# Patient Record
Sex: Female | Born: 1985 | Race: White | Hispanic: No
Health system: Western US, Academic
[De-identification: ages and names within clinical notes are randomized; demographics above are authoritative.]

## PROBLEM LIST (undated history)

## (undated) DIAGNOSIS — R012 Other cardiac sounds: Secondary | ICD-10-CM

## (undated) HISTORY — PX: NO PRIOR SURGERIES: 100

## (undated) HISTORY — DX: Other cardiac sounds: R01.2

## (undated) DEATH — deceased

---
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
## 2005-07-14 ENCOUNTER — Emergency Department (HOSPITAL_COMMUNITY): Admission: EM | Admit: 2005-07-14 | Discharge: 2005-07-14 | Payer: Self-pay | Admitting: Emergency Medicine

## 2009-06-30 ENCOUNTER — Emergency Department (HOSPITAL_COMMUNITY): Admission: EM | Admit: 2009-06-30 | Discharge: 2009-06-30 | Payer: Self-pay | Admitting: Emergency Medicine

## 2011-03-03 ENCOUNTER — Other Ambulatory Visit: Payer: Self-pay | Admitting: Obstetrics & Gynecology

## 2011-03-03 DIAGNOSIS — Z30431 Encounter for routine checking of intrauterine contraceptive device: Secondary | ICD-10-CM

## 2011-03-04 ENCOUNTER — Other Ambulatory Visit: Payer: Self-pay

## 2011-03-10 ENCOUNTER — Ambulatory Visit
Admission: RE | Admit: 2011-03-10 | Discharge: 2011-03-10 | Disposition: A | Discharge status: Home / Self Care | Payer: 59 | Source: Ambulatory Visit | Attending: Obstetrics & Gynecology | Admitting: Obstetrics & Gynecology

## 2011-03-10 DIAGNOSIS — Z30431 Encounter for routine checking of intrauterine contraceptive device: Secondary | ICD-10-CM

## 2011-11-28 ENCOUNTER — Encounter: Payer: Self-pay | Admitting: Internal Medicine

## 2011-11-28 ENCOUNTER — Ambulatory Visit (INDEPENDENT_AMBULATORY_CARE_PROVIDER_SITE_OTHER): Payer: 59 | Admitting: Internal Medicine

## 2011-11-28 ENCOUNTER — Ambulatory Visit (INDEPENDENT_AMBULATORY_CARE_PROVIDER_SITE_OTHER)
Admission: RE | Admit: 2011-11-28 | Discharge: 2011-11-28 | Disposition: A | Discharge status: Home / Self Care | Payer: 59 | Source: Ambulatory Visit | Attending: Internal Medicine | Admitting: Internal Medicine

## 2011-11-28 ENCOUNTER — Other Ambulatory Visit (INDEPENDENT_AMBULATORY_CARE_PROVIDER_SITE_OTHER): Payer: 59

## 2011-11-28 VITALS — BP 140/80 | HR 65 | Temp 98.6°F | Resp 16 | Wt 155.0 lb

## 2011-11-28 DIAGNOSIS — R519 Headache, unspecified: Secondary | ICD-10-CM | POA: Insufficient documentation

## 2011-11-28 DIAGNOSIS — R51 Headache: Secondary | ICD-10-CM

## 2011-11-28 DIAGNOSIS — E0789 Other specified disorders of thyroid: Secondary | ICD-10-CM

## 2011-11-28 DIAGNOSIS — S0990XA Unspecified injury of head, initial encounter: Secondary | ICD-10-CM

## 2011-11-28 LAB — TSH: TSH: 1.63 u[IU]/mL (ref 0.35–5.50)

## 2011-11-28 LAB — CBC WITH DIFFERENTIAL/PLATELET
Hemoglobin: 13.6 g/dL (ref 12.0–15.0)
Lymphs Abs: 1.6 10*3/uL (ref 0.7–4.0)
MCHC: 33.3 g/dL (ref 30.0–36.0)
MCV: 96.9 fl (ref 78.0–100.0)
Monocytes Relative: 7.6 % (ref 3.0–12.0)
Neutrophils Relative %: 67.8 % (ref 43.0–77.0)
Platelets: 229 10*3/uL (ref 150.0–400.0)

## 2011-11-28 NOTE — Assessment & Plan Note (Signed)
She will have a CT done today to look for fracture, SDH, ICH, etc
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
## 2011-11-28 NOTE — Assessment & Plan Note (Signed)
I will check her labs today to look for hashimoto's, hyper-hypo thyroidism, etc

## 2011-11-28 NOTE — Assessment & Plan Note (Signed)
She is getting relief with motrin
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
## 2011-11-30 ENCOUNTER — Encounter: Payer: Self-pay | Admitting: Internal Medicine

## 2011-12-01 LAB — THYROID PEROXIDASE ANTIBODY: Thyroperoxidase Ab SerPl-aCnc: <10 IU/mL (ref <35.0)
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
## 2013-03-15 ENCOUNTER — Telehealth (INDEPENDENT_AMBULATORY_CARE_PROVIDER_SITE_OTHER): Payer: Self-pay | Admitting: Family

## 2013-03-15 ENCOUNTER — Encounter (INDEPENDENT_AMBULATORY_CARE_PROVIDER_SITE_OTHER): Payer: Self-pay | Admitting: Family

## 2013-03-15 ENCOUNTER — Ambulatory Visit (INDEPENDENT_AMBULATORY_CARE_PROVIDER_SITE_OTHER): Payer: Medicaid Other | Admitting: Family

## 2013-03-15 VITALS — BP 106/70 | HR 64 | Temp 97.7°F | Resp 12 | Ht 67.6 in | Wt 147.0 lb

## 2013-03-15 LAB — PR U/A NONAUTO DIPSTICK ONLY, ONSITE
Bilirubin (Indirect): NEGATIVE
Glucose, Urine: NEGATIVE mg/dL
Ketones, URN: NEGATIVE mg/dL
Leukocytes: NEGATIVE
Nitrite, URN: NEGATIVE
Occult Blood, URN: NEGATIVE
Protein: NEGATIVE mg/dL
Specific Gravity, URN: 1.015 (ref 1.005–1.030)
Urobilinogen, URN: 0.2 E.U./dL (ref 0.2–1.0)
pH, URN (UWNC): 7 (ref 5–8)

## 2013-03-15 NOTE — Addendum Note (Signed)
Addended by: Therisa Doyne on: 03/15/2013 05:00 PM     Modules accepted: Orders

## 2013-03-15 NOTE — Telephone Encounter (Signed)
Megan Vincent wants [facility] to know they can't see this patient due to insurance issue, he says she is "outside of their service area"

## 2013-03-15 NOTE — Patient Instructions (Signed)
Echocardiogram- [phone]  [facility]  Call for an appt.      Mitral Valve Prolapse    The mitral valve is one of four valves that open and close to control the flow of blood into and out of the heart.Withmitral valve prolapse (MVP), the mitral valve is loose or floppy. This is often an inherited condition. It can also occur as a result of another disease process.  In most persons with mitral valve prolapse the condition is benign and causes no symptoms. In others, symptoms may include fast, pounding, or irregular heartbeat, chest pains, dizziness, tiredness, fainting spells, and shortness of breath. Mitral valve prolapse may also be associated with panic attacks, anxiety, or fatigue. More serious symptoms are uncommon, but may occur in men over 50 with mitral valve prolapse.  Home Care:  1. Benign cases do not require any special treatment or restricted activity.  2. If the doctor is able to hear a "click-murmur" in your heart:   Practice regular tooth brushing and flossing. This will keep your gums and teeth healthy and reduce your risk of heart valve infection.   Inform your dentist or surgeon before having any procedure done. You will need to take antibiotics before the procedure to reduce risk of heart valve infection.  3. Limit intake of caffeine, alcohol, and stimulants and avoid strenuous activity if you have fainting spells or palpitations not controlled with medicine.  Follow Up  with your doctor or as advised by our staff. Additional tests may be needed. You should have a checkup every 2 to 3 years to assess valve function.  Get Prompt Medical Attention  if any of the following occur:   Chest pain that is new or worsens or does not go away within 24 hours   Ankle swelling or shortness of breath   Palpitations (fluttering, racing, or pounding heartbeat) lasting longer than 5 minutes   Dizzy spells, lightheadedness orfainting   Weakness or numbness of an arm, leg, or one side of the  face   Difficulty with speech or vision   [address], [address] [place], [place] [address]. All rights reserved. This information is not intended as a substitute for professional medical care. Always follow your healthcare professional's instructions.

## 2013-03-15 NOTE — Telephone Encounter (Signed)
I called Megan Vincent and he said that he was mistaken in thinking that [facility] could not see Megan Vincent. He told me that he has already called [facility] and straightened it out and she has been scheduled at [facility].
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
## 2013-03-17 ENCOUNTER — Other Ambulatory Visit: Payer: Self-pay

## 2013-03-22 ENCOUNTER — Other Ambulatory Visit (HOSPITAL_BASED_OUTPATIENT_CLINIC_OR_DEPARTMENT_OTHER): Payer: Medicaid Other | Admitting: Cardiology

## 2013-03-22 NOTE — Progress Notes (Signed)
Echocardiogram completed. Once finalized, please look under the media tab in Chart Review for the report.  The report is also available in [facility] and will be found in the Cardiology section under Echocardiography.    Tim Heidal, RDCS

## 2013-03-24 ENCOUNTER — Encounter (INDEPENDENT_AMBULATORY_CARE_PROVIDER_SITE_OTHER): Payer: Self-pay | Admitting: Family

## 2013-04-12 ENCOUNTER — Other Ambulatory Visit (INDEPENDENT_AMBULATORY_CARE_PROVIDER_SITE_OTHER): Payer: Self-pay | Admitting: Family

## 2013-04-12 ENCOUNTER — Ambulatory Visit (INDEPENDENT_AMBULATORY_CARE_PROVIDER_SITE_OTHER): Payer: Medicaid Other | Admitting: Family

## 2013-04-12 ENCOUNTER — Encounter (INDEPENDENT_AMBULATORY_CARE_PROVIDER_SITE_OTHER): Payer: Self-pay | Admitting: Family

## 2013-04-12 VITALS — BP 100/78 | HR 56 | Temp 97.5°F | Resp 16 | Ht 68.0 in | Wt 145.8 lb

## 2013-04-12 LAB — PR U/A NONAUTO DIPSTICK ONLY, ONSITE
Bilirubin (Indirect): NEGATIVE
Glucose, Urine: NEGATIVE mg/dL
Ketones, URN: NEGATIVE mg/dL
Nitrite, URN: NEGATIVE
Occult Blood, URN: NEGATIVE
Protein: NEGATIVE mg/dL
Specific Gravity, URN: 1.02 (ref 1.005–1.030)
Urobilinogen, URN: 0.2 E.U./dL (ref 0.2–1.0)
pH, URN (UWNC): 5.5 (ref 5.0–8.0)

## 2013-04-12 MED ORDER — FLUCONAZOLE 150 MG OR TABS
150.0000 mg | ORAL_TABLET | Freq: Once | ORAL | Status: AC
Start: 2013-04-12 — End: 2013-04-12

## 2013-04-12 MED ORDER — METRONIDAZOLE 500 MG OR TABS
500.0000 mg | ORAL_TABLET | Freq: Two times a day (BID) | ORAL | Status: AC
Start: 2013-04-12 — End: ?

## 2013-04-12 NOTE — Progress Notes (Signed)
Here for general physical:  Concerns:  Still having some chest tightness and tension- thinks it might be related to coffee and food intake.  Pt denies chest pain, shortness of breath, change in bowel or bladder.  Energy level stable.   Exercise: runs 3 times a week  Diet: healthy- becoming a vegan  Screenings: Due for a Pap today, previous LEEP    PHYSICAL EXAM:  General: healthy, alert, no distress, relaxed, cooperative, oriented x 3  Skin: Skin color, texture, turgor normal. No rashes or concerning lesions  Head: Normocephalic. No masses, lesions, tenderness or abnormalities  Eyes: Conjunctivae/corneas clear, PERRL, EOM's intact  Ears: External ears normal. Canals clear. TM's normal.  Nose:normal, nares patent  Oropharynx: Lips, mucosa, and tongue normal. Teeth and gums normal., posterior pharynx with erythema no drainage, tonsils 1+  Neck: supple. Scattered cervical adenopathy. Thyroid symmetric, normal size, without nodules  Lungs: clear to auscultation  Heart: normal rate, regular rhythm and murmur stable, no clicks, or gallops  Breasts symmetrical without suspicious nodules or lesions, no nipple discharge.  Back: Back symmetric, no deformity; ROM normal; No CVA tenderness.  Abd: soft, non-tender. BS normal. No masses.  Distended, no organomegaly present.  Extremities: intact with good pulses and equal strength  Feet: intact and sensate with good pulses.  Genital: ovaries non-palpable, [place] vaginal tone, introitus intact, pap obtained, IUD string present, cervix moist and intact, foul odor and moderate discharge  Rectal: deferred    Assessment/Plan:  1. Abnormal Pap smear of cervix    - PAP SMEAR (OPTIONAL HPV)  - GC&CHLAM NUCLEIC ACID DETECTN    2. Murmur  Stable, benign, mitral regurg    3. Tension  Patient Instructions   Omeprazole or prilosec 20 mg first thing in the AM on an empty stomach if you will consume the trigger foods  Famotidine or Pepcid 20 mg twice a day as needed for acid discomfort    Take  metronidazole twice a day for 5 days and the fluconazole just once.    Happy 2015!      4. Vaginitis and vulvovaginitis, unspecified    - Fluconazole 150 MG Oral Tab; Take 1 tablet (150 mg) by mouth Once for 1 dose.  Dispense: 1 tablet; Refill: 0  - MetroNIDAZOLE 500 MG Oral Tab; Take 1 tablet (500 mg) by mouth 2 times a day with meals. For infection. Take until gone.  Dispense: 10 tablet; Refill: 0  - U/A NONAUTO DIPSTICK ONLY, ONSITE  - GC&CHLAM NUCLEIC ACID DETECTN    5. Routine general medical examination at a health care facility    - PAP SMEAR (OPTIONAL HPV)  - U/A NONAUTO DIPSTICK ONLY, ONSITE  - GC&CHLAM NUCLEIC ACID DETECTN

## 2013-04-12 NOTE — Patient Instructions (Addendum)
Omeprazole or prilosec 20 mg first thing in the AM on an empty stomach if you will consume the trigger foods  Famotidine or Pepcid 20 mg twice a day as needed for acid discomfort    Take metronidazole twice a day for 5 days after the New Year and the fluconazole just once, today.    Happy 2015!

## 2013-04-13 ENCOUNTER — Telehealth (INDEPENDENT_AMBULATORY_CARE_PROVIDER_SITE_OTHER): Payer: Self-pay | Admitting: Family

## 2013-04-13 LAB — GC&CHLAM NUCLEIC ACID DETECTN
Chlam Trachomatis Nucleic Acid: NEGATIVE
N.Gonorrhoeae(GC) Nucleic Acid: NEGATIVE

## 2013-04-13 NOTE — Telephone Encounter (Signed)
Called Rachele back and told her that it is urine. Sela Hua checked this over the day we went it and could not see anything wrong with the way that it was ordered.

## 2013-04-13 NOTE — Telephone Encounter (Signed)
Lab has a specimen labeled cervical - but specimen is urine

## 2013-04-15 LAB — CERVICAL CANCER SCREENING

## 2014-04-30 ENCOUNTER — Other Ambulatory Visit: Payer: Self-pay

## 2014-07-11 ENCOUNTER — Emergency Department
Admission: EM | Admit: 2014-07-11 | Discharge: 2014-07-11 | Disposition: A | Discharge status: Home / Self Care | Payer: Self-pay | Attending: Emergency Medicine | Admitting: Emergency Medicine

## 2014-07-11 DIAGNOSIS — L509 Urticaria, unspecified: Secondary | ICD-10-CM

## 2014-07-11 DIAGNOSIS — R079 Chest pain, unspecified: Secondary | ICD-10-CM

## 2014-07-11 LAB — CBC, DIFF
% Basophils: 1 %
% Eosinophils: 0 %
% Immature Granulocytes: 0 %
% Lymphocytes: 19 %
% Monocytes: 8 %
% Neutrophils: 72 %
Absolute Eosinophil Count: 0.02 10*3/uL (ref 0.00–0.50)
Absolute Lymphocyte Count: 1.32 10*3/uL (ref 1.00–4.80)
Basophils: 0.06 10*3/uL (ref 0.00–0.20)
Hematocrit: 40 % (ref 36–45)
Hemoglobin: 13.5 g/dL (ref 11.5–15.5)
Immature Granulocytes: 0.02 10*3/uL (ref 0.00–0.05)
MCH: 32 pg (ref 27.3–33.6)
MCHC: 33.6 g/dL (ref 32.2–36.5)
MCV: 95 fL (ref 81–98)
Monocytes: 0.56 10*3/uL (ref 0.00–0.80)
Neutrophils: 4.99 10*3/uL (ref 1.80–7.00)
Platelet Count: 207 10*3/uL (ref 150–400)
RBC: 4.22 10*6/uL (ref 3.80–5.00)
RDW-CV: 12.3 % (ref 11.6–14.4)
WBC: 6.97 10*3/uL (ref 4.3–10.0)

## 2014-07-11 LAB — PROTHROMBIN & PTT
Partial Thromboplastin Time: 27 s (ref 22–35)
Prothrombin INR: 1 (ref 0.8–1.3)
Prothrombin Time Patient: 13.3 s (ref 10.7–15.6)

## 2014-07-11 LAB — LAB ADD ON ORDER

## 2014-07-11 LAB — COMPREHENSIVE METABOLIC PANEL
ALT (GPT): 9 U/L (ref 7–33)
AST (GOT): 15 U/L (ref 9–38)
Albumin: 4.6 g/dL (ref 3.5–5.2)
Alkaline Phosphatase (Total): 46 U/L (ref 25–100)
Anion Gap: 6 (ref 4–12)
Bilirubin (Total): 0.4 mg/dL (ref 0.2–1.3)
Calcium: 9.5 mg/dL (ref 8.9–10.2)
Carbon Dioxide, Total: 27 meq/L (ref 22–32)
Chloride: 105 meq/L (ref 98–108)
Creatinine: 0.64 mg/dL (ref 0.38–1.02)
GFR, Calc, African American: >60 mL/min (ref >59)
GFR, Calc, European American: >60 mL/min (ref >59)
Glucose: 108 mg/dL (ref 62–125)
Potassium: 3.6 meq/L (ref 3.6–5.2)
Protein (Total): 7.6 g/dL (ref 6.0–8.2)
Sodium: 138 meq/L (ref 135–145)
Urea Nitrogen: 12 mg/dL (ref 8–21)

## 2014-07-11 LAB — C_REACTIVE PROTEIN: C_Reactive Protein: 1.3 mg/L (ref 0.0–10.0)

## 2014-07-11 LAB — SED RATE: Erythrocyte Sedimentation Rate: 7 mm/h (ref 0–20)

## 2014-07-11 LAB — TROPONIN_I
Troponin_I Interpretation: NORMAL
Troponin_I: <0.03 ng/mL (ref <0.04)

## 2014-07-17 ENCOUNTER — Ambulatory Visit: Payer: Self-pay

## 2014-07-17 NOTE — Telephone Encounter (Signed)
DX: [facility]  Methylprednisone, Metronidazole PO, Doxycycline PO, Fluconazole PO   Hx: hives, steroids given  Pelvic pain worsening per patient two hours ago  Pain is below belly button, center to the right,   Feels like a pinching  Pelvic pain rates @ 4-5 on scale of 1/10 at rest  When walking around pain is 6-7 on scale of 1/10    No pain medication taken today  Patient returned to work  Patient has follow up appointment tomorrow with pcp

## 2014-07-17 NOTE — Telephone Encounter (Signed)
-----   Message from Ward Chattersowena W Argana sent at 07/17/2014  7:05 PM PDT -----  Regarding: Has a nose bleed, increased pain in pelvis, being treated for Pelvic inflammatory disease  >> Valinda PartyROWENA W [facility] 07/17/2014 07:05 PM  Has a nose bleed, increased pain in pelvis, being treated for Pelvic inflammatory disease

## 2014-07-17 NOTE — Telephone Encounter (Signed)
Protocol: PELVIC PAIN - FEMALE-ADULT-AH  Affirmative: [1] MILD (e.g., does not interfere with normal activities) pelvic pain AND [2] pain comes and goes (cramps) AND [3] present > 48 hours  Disposition of See Physician Within 24 Hours suggested.

## 2016-02-13 ENCOUNTER — Other Ambulatory Visit: Payer: Self-pay

## 2017-02-12 ENCOUNTER — Other Ambulatory Visit: Payer: Self-pay

## 2020-10-18 ENCOUNTER — Telehealth: Payer: Self-pay | Admitting: *Deleted

## 2020-12-10 NOTE — Telephone Encounter (Signed)
error
